# Patient Record
Sex: Male | Born: 2005 | Race: Black or African American | Hispanic: No | Marital: Single | Smoking: Never smoker
Health system: Southern US, Community
[De-identification: ages and names within clinical notes are randomized; demographics above are authoritative.]

## PROBLEM LIST (undated history)

## (undated) DIAGNOSIS — J45909 Unspecified asthma, uncomplicated: Secondary | ICD-10-CM

## (undated) DIAGNOSIS — L309 Dermatitis, unspecified: Secondary | ICD-10-CM

---
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
## 2024-06-09 ENCOUNTER — Other Ambulatory Visit: Payer: Self-pay

## 2024-06-10 ENCOUNTER — Ambulatory Visit: Admitting: Family Medicine

## 2024-06-10 NOTE — Progress Notes (Deleted)
 Office Note 06/10/2024  CC: No chief complaint on file.   HPI:  Joshua Henderson is a 18 y.o.  male who is here to establish care. Patient's most recent primary MD: [facility].  Primary care [facility]. Old records *** reviewed prior to or during today's visit.   Office visit/CPE with previous provider 05/09/2024: Was referred for neuropsychology testing due to suspicion of autism. CBC, c-Met, lipid panel, hemoglobin A1c, thyroid all normal. Heritage vitamin D deficiency.  He was started on 50,000 unit vitamin D tab once a week at that time.  Past Medical History:  Diagnosis Date   Asthma    Eczema     No past surgical history on file.  No family history on file.  Social History   Socioeconomic History   Marital status: Single    Spouse name: Not on file   Number of children: Not on file   Years of education: Not on file   Highest education level: Not on file  Occupational History   Not on file  Tobacco Use   Smoking status: Never   Smokeless tobacco: Never  Substance and Sexual Activity   Alcohol use: No   Drug use: Not on file   Sexual activity: Not on file  Other Topics Concern   Not on file  Social History Narrative   Not on file   Social Drivers of Health   Financial Resource Strain: Not on file  Food Insecurity: Not on file  Transportation Needs: Not on file  Physical Activity: Not on file  Stress: Not on file  Social Connections: Not on file  Intimate Partner Violence: Not on file    Outpatient Encounter Medications as of 06/10/2024  Medication Sig   cetirizine (ZYRTEC) 10 MG tablet Take 1 tablet by mouth daily.   fluticasone  (FLONASE ) 50 MCG/ACT nasal spray Place 2 sprays into both nostrils daily.   hydrocortisone 2.5 % cream Apply 1 Application topically 2 (two) times daily as needed (eczema).   ondansetron  (ZOFRAN  ODT) 4 MG disintegrating tablet Take 1 tablet (4 mg total) by mouth every 8 (eight) hours as needed for nausea or vomiting.    triamcinolone cream (KENALOG) 0.1 % Apply 1 Application topically 2 (two) times daily as needed (eczema).   Vitamin D, Ergocalciferol, (DRISDOL) 1.25 MG (50000 UNIT) CAPS capsule Take 1 capsule by mouth once a week.   No facility-administered encounter medications on file as of 06/10/2024.    No Known Allergies  Review of Systems *** PE; There were no vitals taken for this visit. There is no height or weight on file to calculate BMI.  Physical Exam  *** Pertinent labs:  none  ASSESSMENT AND PLAN:   No problem-specific Assessment & Plan notes found for this encounter.   An After Visit Summary was printed and given to the patient.  No follow-ups on file.  Signed:  Gerlene Hockey, MD           06/10/2024
